# Patient Record
Sex: Male | Born: 1980 | Race: White | Hispanic: No | Marital: Married | Smoking: Never smoker
Health system: Southern US, Community
[De-identification: ages and names within clinical notes are randomized; demographics above are authoritative.]

## PROBLEM LIST (undated history)

## (undated) DIAGNOSIS — C439 Malignant melanoma of skin, unspecified: Secondary | ICD-10-CM

## (undated) DIAGNOSIS — F419 Anxiety disorder, unspecified: Secondary | ICD-10-CM

## (undated) DIAGNOSIS — T7840XA Allergy, unspecified, initial encounter: Secondary | ICD-10-CM

## (undated) HISTORY — PX: SHOULDER SURGERY: SHX246

## (undated) HISTORY — PX: APPENDECTOMY: SHX54

## (undated) HISTORY — DX: Anxiety disorder, unspecified: F41.9

## (undated) HISTORY — PX: CHOLECYSTECTOMY: SHX55

## (undated) HISTORY — DX: Allergy, unspecified, initial encounter: T78.40XA

---

## 2018-01-30 ENCOUNTER — Other Ambulatory Visit: Payer: Self-pay | Admitting: Orthopedic Surgery

## 2018-01-30 DIAGNOSIS — M25511 Pain in right shoulder: Secondary | ICD-10-CM

## 2018-02-16 ENCOUNTER — Ambulatory Visit
Admission: RE | Admit: 2018-02-16 | Discharge: 2018-02-16 | Disposition: A | Discharge status: Home / Self Care | Payer: Managed Care, Other (non HMO) | Source: Ambulatory Visit | Attending: Orthopedic Surgery | Admitting: Orthopedic Surgery

## 2018-02-16 ENCOUNTER — Ambulatory Visit
Admission: RE | Admit: 2018-02-16 | Discharge: 2018-02-16 | Disposition: A | Discharge status: Home / Self Care | Payer: Self-pay | Source: Ambulatory Visit | Attending: Orthopedic Surgery | Admitting: Orthopedic Surgery

## 2018-02-16 DIAGNOSIS — M25511 Pain in right shoulder: Secondary | ICD-10-CM

## 2018-02-16 MED ORDER — IOPAMIDOL (ISOVUE-M 200) INJECTION 41%
15.0000 mL | Freq: Once | INTRAMUSCULAR | Status: AC
  Administered 2018-02-16: 15 mL via INTRA_ARTICULAR

## 2019-02-04 IMAGING — MR MR SHOULDER*R* W/CM
6 series · 40 of 40 positions shown · IV contrast (agent unspecified)
Comparison: Images from contrast injection reviewed.

CLINICAL DATA: Right shoulder pain and clicking for 6-8 months. No
known injury.

EXAM:
MR ARTHROGRAM OF THE RIGHT SHOULDER
TECHNIQUE: Multiplanar, multisequence MR imaging of the right shoulder was
performed following the administration of intra-articular contrast.
CONTRAST:  See Injection Documentation.

[Series 4: T1 fat-sat · axial · 4.0mm · 0.25mm/px · z∈[-47,+47]mm · 6 of 21 slices shown (1 of 4)]
[im 1/21]
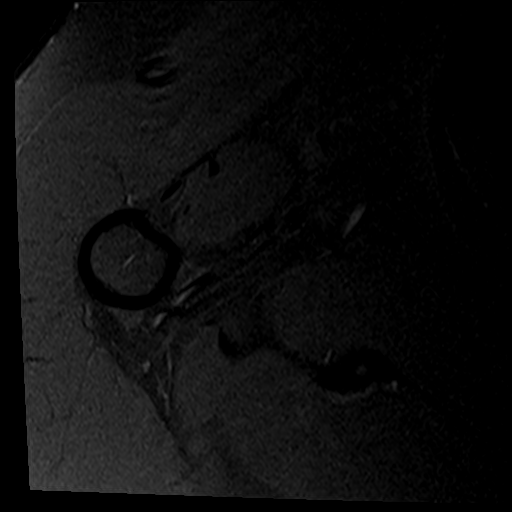
[im 5/21]
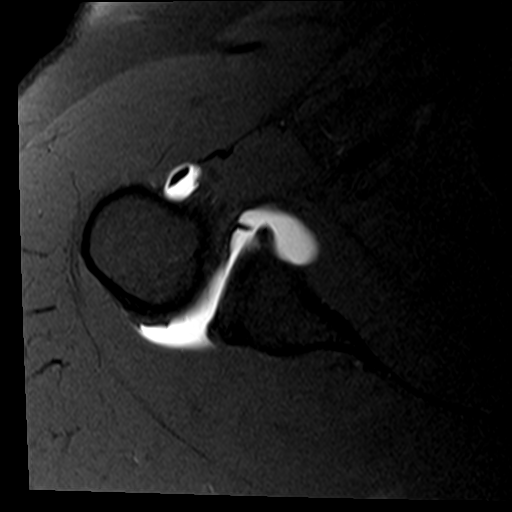
[im 9/21]
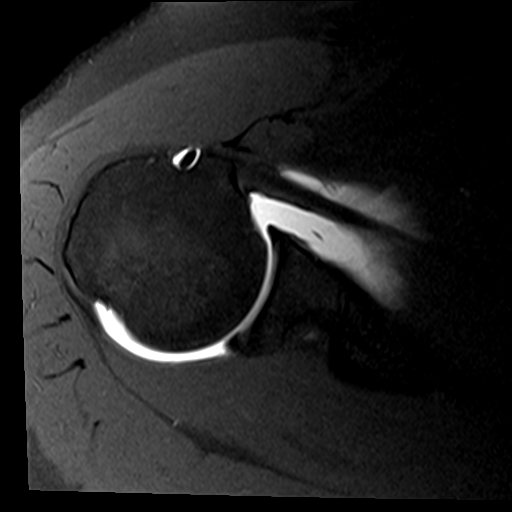
[im 13/21]
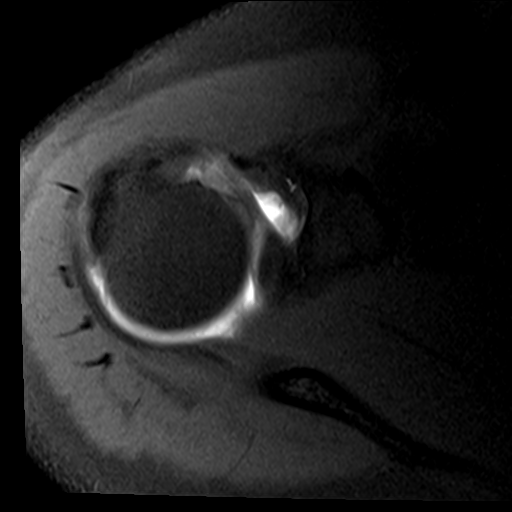
[im 17/21]
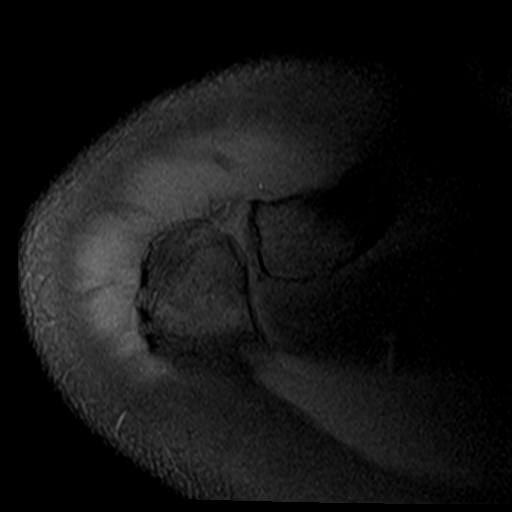
[im 21/21]
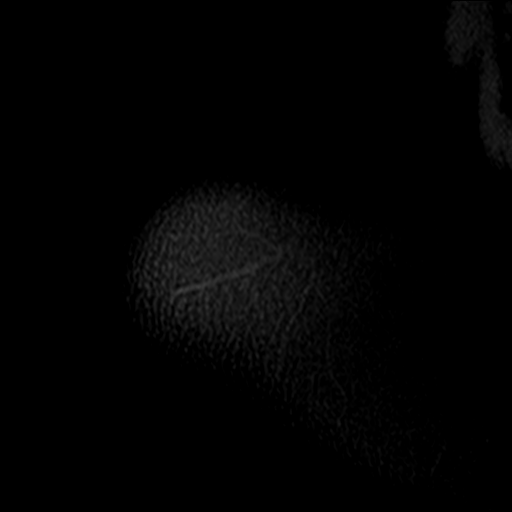

[Series 5: T2 fat-sat · oblique · 4.0mm · 0.55mm/px · 7 of 23 slices shown (1 of 2)]
[im 1/23]
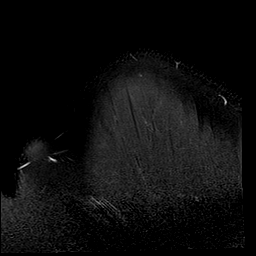
[im 4/23]
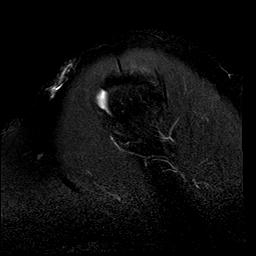
[im 8/23]
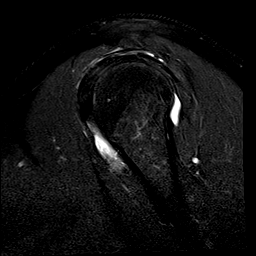
[im 12/23]
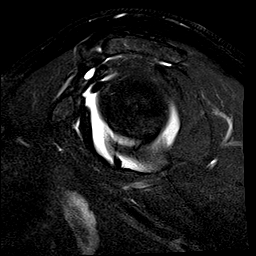
[im 15/23]
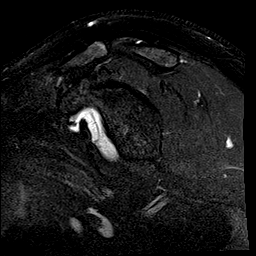
[im 19/23]
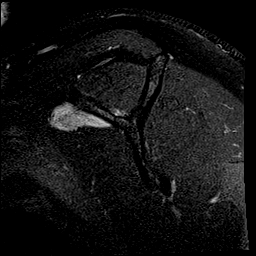
[im 23/23]
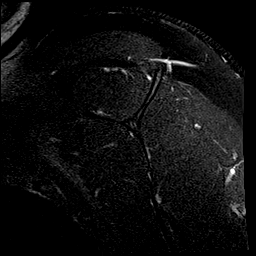

[Series 6: T1 fat-sat · oblique · 4.0mm · 0.44mm/px · 7 of 20 slices shown (2 of 4)]
[im 1/20]
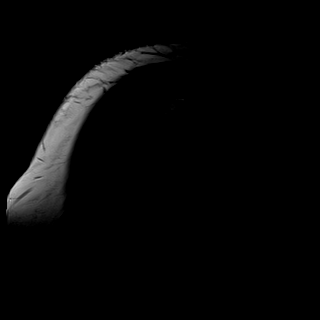
[im 4/20]
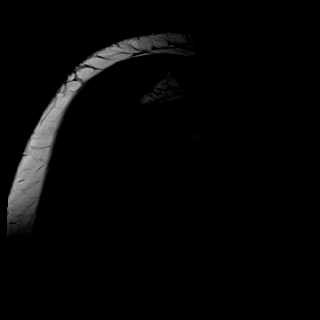
[im 7/20]
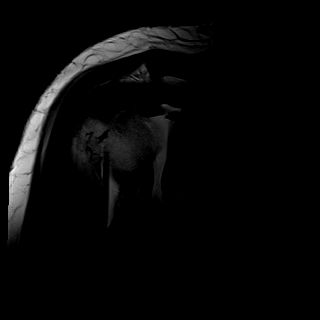
[im 10/20]
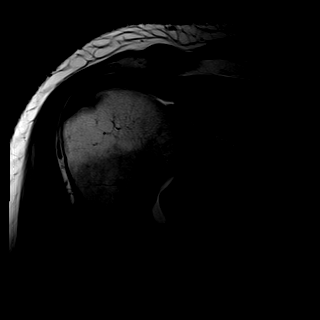
[im 13/20]
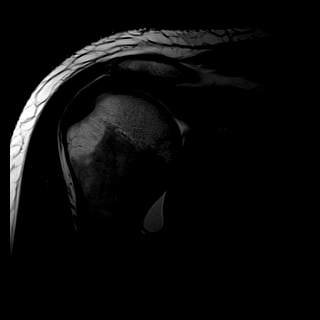
[im 16/20]
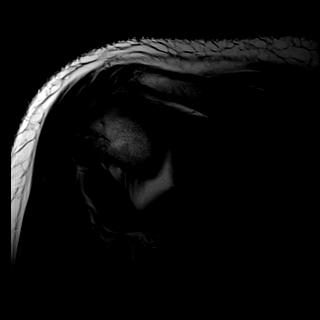
[im 20/20]
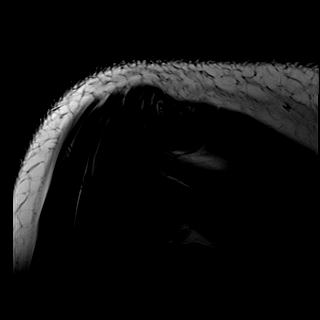

[Series 7: T1 fat-sat · oblique · 4.0mm · 0.55mm/px · 7 of 20 slices shown (3 of 4)]
[im 1/20]
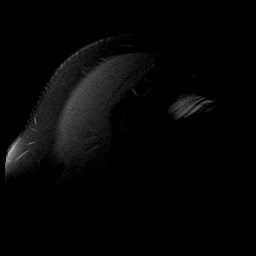
[im 4/20]
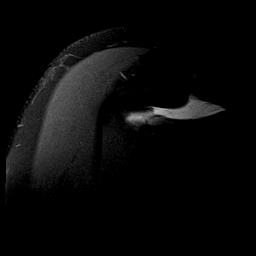
[im 7/20]
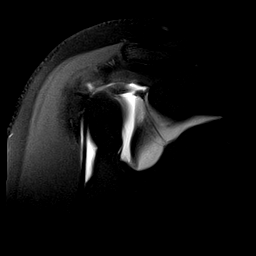
[im 10/20]
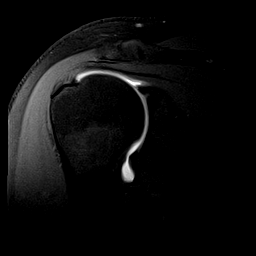
[im 13/20]
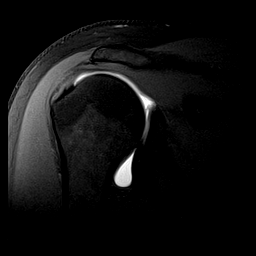
[im 16/20]
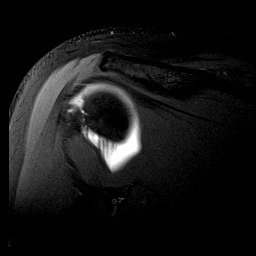
[im 20/20]
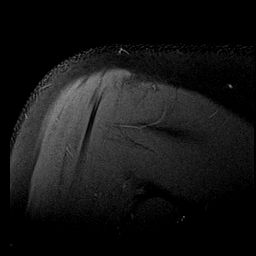

[Series 8: T2 fat-sat · oblique · 4.0mm · 0.55mm/px · 7 of 20 slices shown (2 of 2)]
[im 1/20]
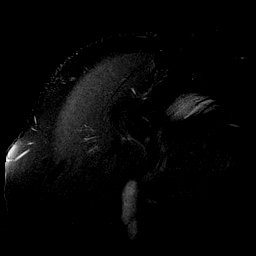
[im 4/20]
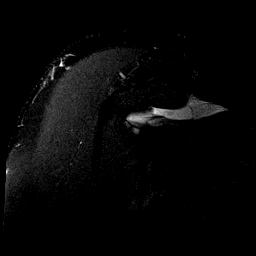
[im 7/20]
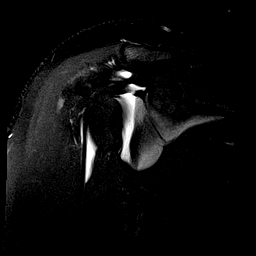
[im 10/20]
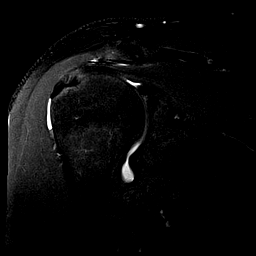
[im 13/20]
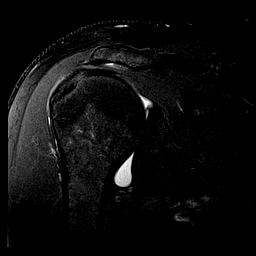
[im 16/20]
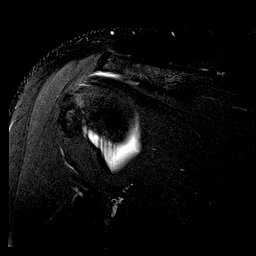
[im 20/20]
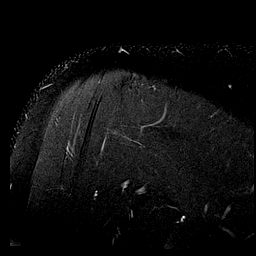

[Series 11: T1 fat-sat · sagittal · 4.0mm · 0.59mm/px · 6 of 19 slices shown (4 of 4)]
[im 1/19]
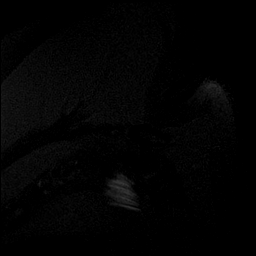
[im 4/19]
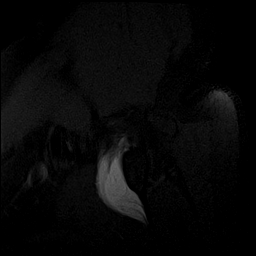
[im 8/19]
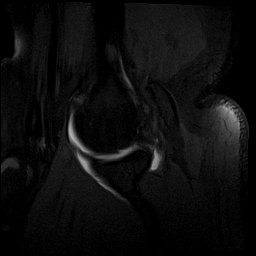
[im 11/19]
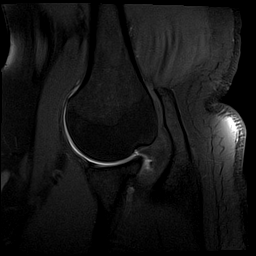
[im 15/19]
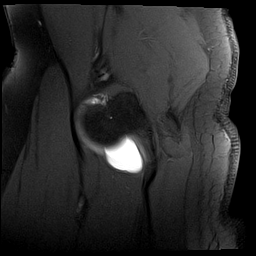
[im 19/19]
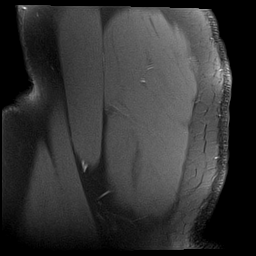

[40 of 40 positions shown; findings below may reference images not displayed]

FINDINGS: Rotator cuff: Mild to moderate rotator cuff tendinopathy without
tear is most notable in the supraspinatus.

Muscles: Normal without atrophy or focal lesion.

Biceps long head: Intact and normal in appearance. The biceps
attachment to the superior labrum appears normal.

Acromioclavicular Joint: Normal.

Glenohumeral Joint: Normal.

Labrum: Intact.

Bones: No fracture or worrisome lesion. The acromion is type 2 with
a small subacromial spur. There is a small volume of fluid but no
contrast in the subacromial/subdeltoid bursa.
IMPRESSION: Mild to moderate supraspinatus and infraspinatus tendinopathy
without tear.

Intact long head of biceps and glenoid labrum.

Small volume of subacromial/subdeltoid fluid consistent with
bursitis.

Type 2 acromion with small subacromial spur noted.

## 2020-05-27 ENCOUNTER — Encounter (HOSPITAL_BASED_OUTPATIENT_CLINIC_OR_DEPARTMENT_OTHER): Payer: Self-pay

## 2020-05-27 ENCOUNTER — Emergency Department (HOSPITAL_BASED_OUTPATIENT_CLINIC_OR_DEPARTMENT_OTHER)
Admission: EM | Admit: 2020-05-27 | Discharge: 2020-05-27 | Disposition: A | Discharge status: Home / Self Care | Payer: Managed Care, Other (non HMO) | Attending: Emergency Medicine | Admitting: Emergency Medicine

## 2020-05-27 ENCOUNTER — Other Ambulatory Visit: Payer: Self-pay

## 2020-05-27 DIAGNOSIS — R4689 Other symptoms and signs involving appearance and behavior: Secondary | ICD-10-CM

## 2020-05-27 DIAGNOSIS — R6883 Chills (without fever): Secondary | ICD-10-CM | POA: Diagnosis not present

## 2020-05-27 DIAGNOSIS — Z79899 Other long term (current) drug therapy: Secondary | ICD-10-CM | POA: Diagnosis not present

## 2020-05-27 DIAGNOSIS — R4182 Altered mental status, unspecified: Secondary | ICD-10-CM | POA: Diagnosis present

## 2020-05-27 LAB — BASIC METABOLIC PANEL
Anion gap: 8 (ref 5–15)
BUN: 16 mg/dL (ref 6–20)
CO2: 24 mmol/L (ref 22–32)
Calcium: 8.9 mg/dL (ref 8.9–10.3)
Chloride: 102 mmol/L (ref 98–111)
Creatinine, Ser: 0.8 mg/dL (ref 0.61–1.24)
GFR calc Af Amer: >60 mL/min (ref >60)
GFR calc non Af Amer: >60 mL/min (ref >60)
Glucose, Bld: 94 mg/dL (ref 70–99)
Potassium: 3.9 mmol/L (ref 3.5–5.1)
Sodium: 134 mmol/L — ABNORMAL LOW (ref 135–145)

## 2020-05-27 LAB — CBC
HCT: 43 % (ref 39.0–52.0)
Hemoglobin: 14.7 g/dL (ref 13.0–17.0)
MCH: 28.4 pg (ref 26.0–34.0)
MCHC: 34.2 g/dL (ref 30.0–36.0)
MCV: 83 fL (ref 80.0–100.0)
Platelets: 236 10*3/uL (ref 150–400)
RBC: 5.18 MIL/uL (ref 4.22–5.81)
RDW: 12.8 % (ref 11.5–15.5)
WBC: 9 10*3/uL (ref 4.0–10.5)
nRBC: 0 % (ref 0.0–0.2)

## 2020-05-27 LAB — CBG MONITORING, ED: Glucose-Capillary: 84 mg/dL (ref 70–99)

## 2020-05-27 LAB — URINALYSIS, ROUTINE W REFLEX MICROSCOPIC
Bilirubin Urine: NEGATIVE
Glucose, UA: NEGATIVE mg/dL
Hgb urine dipstick: NEGATIVE
Ketones, ur: NEGATIVE mg/dL
Leukocytes,Ua: NEGATIVE
Nitrite: NEGATIVE
Protein, ur: NEGATIVE mg/dL
Specific Gravity, Urine: 1.01 (ref 1.005–1.030)
pH: 6 (ref 5.0–8.0)

## 2020-05-27 NOTE — Discharge Instructions (Signed)
I think your symptoms are most likely from delta 8 as we spoke about.  However they could be from panic attacks, please use the attached instructions.  Your work-up today was reassuring, if you still have the symptoms please follow-up with your primary care in the next week.  Please come back to the emergency department for any new or worsening concerning symptoms.  Stop using Delta 8.

## 2020-05-27 NOTE — ED Triage Notes (Addendum)
Pt states he had episode yesterday where "things around me didn't feel real"-"shaky hands-cold sweats"-he felt it had been ~5 min but later realized it had been 35 mins-pt was working [employer] he did have a HA after event-states he felt better/baseline but "in a fog" the rest of the day-similar episode again today ~915am at work-lasted ~20 min-A/O-NAD-steady gait-denies any recent head injury-pt later added he has been using "delta 8" x 1-2 weeks

## 2020-05-27 NOTE — ED Provider Notes (Signed)
[facility] Provider Note   CSN: [id number] Arrival date & time: 05/27/20  1156     History Chief Complaint  Patient presents with  . Altered Mental Status    [facility] is a 39 y.o. male with past medical history of anxiety that presents the emergency department today for being altered.  Patient present with wife, states that for the past 2 weeks patient has been not acting himself.  Describes that he is having mental fog.  States that he cannot concentrate at work, is extremely anxious and having chills and cold sweats during work.  States that time feels like time is passing faster than normal.  Wife also states that he has not been oriented to time lately.  Started using delta 8 about 2 weeks ago.  Last use was 3 days ago.  Denies any dizziness, headache, vision changes, blurry vision, neck pain, chest pain, shortness of breath, nausea, vomiting, abdominal pain.  Denies any sick contacts.  Has not gotten Covid.  Has gotten Covid vaccine.  Denies any sick contacts.  States that he was generally healthy before this, however does have history of anxiety.  States that he has never had a panic attack before, states that cold sweats occur and then brain fog lasts for several hours.  Is intermittent.  Is not experiencing any of them now.  Last episode last night.  No other drug use.  No alcohol use.  No numbness, tingling, weakness, syncope.  No fevers.  Eating and drinking normally.  No toxins that he is aware of at work or at home.  No liver problems.  No new medications.  No head trauma. HPI     Past Medical History:  Diagnosis Date  . Allergies   . Anxiety     There are no problems to display for this patient.   Past Surgical History:  Procedure Laterality Date  . APPENDECTOMY    . CHOLECYSTECTOMY    . SHOULDER SURGERY         No family history on file.  Social History   Tobacco Use  . Smoking status: Never Smoker  . Smokeless tobacco:  Never Used  Vaping Use  . Vaping Use: Never used  Substance Use Topics  . Alcohol use: Yes    Comment: occ  . Drug use: Never    Home Medications Prior to Admission medications   Medication Sig Start Date End Date Taking? Authorizing Provider  escitalopram (LEXAPRO) 10 MG tablet Take 10 mg by mouth at bedtime.   Yes [provider]  loratadine (CLARITIN) 10 MG tablet Take 10 mg by mouth at bedtime.   Yes [provider]  montelukast (SINGULAIR) 10 MG tablet Take 10 mg by mouth at bedtime.   Yes [provider]    Allergies    Patient has no known allergies.  Review of Systems   Review of Systems  Constitutional: Positive for chills. Negative for diaphoresis, fatigue and fever.  HENT: Negative for congestion, sore throat and trouble swallowing.   Eyes: Negative for pain and visual disturbance.  Respiratory: Negative for cough, shortness of breath and wheezing.   Cardiovascular: Negative for chest pain, palpitations and leg swelling.  Gastrointestinal: Negative for abdominal distention, abdominal pain, diarrhea, nausea and vomiting.  Genitourinary: Negative for difficulty urinating.  Musculoskeletal: Negative for back pain, neck pain and neck stiffness.  Skin: Negative for pallor.  Neurological: Negative for dizziness, tremors, seizures, syncope, facial asymmetry, speech difficulty, weakness, light-headedness,  numbness and headaches.  Psychiatric/Behavioral: Negative for confusion.    Physical Exam Updated Vital Signs BP 125/75 (BP Location: Right Arm)   Pulse 82   Temp 97.8 F (36.6 C) (Oral)   Resp 18   Ht 5\' 11"  (1.803 m)   Wt 117 kg   SpO2 100%   BMI 35.98 kg/m   Physical Exam Constitutional:      General: He is not in acute distress.    Appearance: Normal appearance. He is not ill-appearing, toxic-appearing or diaphoretic.  HENT:     Head: Normocephalic and atraumatic.     Mouth/Throat:     Mouth: Mucous membranes are moist.      Pharynx: Oropharynx is clear.  Eyes:     General: No scleral icterus.    Extraocular Movements: Extraocular movements intact.     Pupils: Pupils are equal, round, and reactive to light.  Cardiovascular:     Rate and Rhythm: Normal rate and regular rhythm.     Pulses: Normal pulses.     Heart sounds: Normal heart sounds.  Pulmonary:     Effort: Pulmonary effort is normal. No respiratory distress.     Breath sounds: Normal breath sounds. No stridor. No wheezing, rhonchi or rales.  Chest:     Chest wall: No tenderness.  Abdominal:     General: Abdomen is flat. There is no distension.     Palpations: Abdomen is soft.     Tenderness: There is no abdominal tenderness. There is no guarding or rebound.  Musculoskeletal:        General: No swelling or tenderness. Normal range of motion.     Cervical back: Normal range of motion and neck supple. No rigidity.     Right lower leg: No edema.     Left lower leg: No edema.  Skin:    General: Skin is warm and dry.     Capillary Refill: Capillary refill takes less than 2 seconds.     Coloration: Skin is not pale.  Neurological:     General: No focal deficit present.     Mental Status: He is alert and oriented to person, place, and time.     Comments: Alert. Clear speech. No facial droop. CNIII-XII grossly intact. Bilateral upper and lower extremities' sensation grossly intact. 5/5 symmetric strength with grip strength and with plantar and dorsi flexion bilaterally. P Normal finger to nose bilaterally. Negative pronator drift. Negative Romberg sign. Gait is steady and intact.    Psychiatric:        Mood and Affect: Mood normal.        Behavior: Behavior normal.     ED Results / Procedures / Treatments   Labs (all labs ordered are listed, but only abnormal results are displayed) Labs Reviewed  BASIC METABOLIC PANEL - Abnormal; Notable for the following components:      Result Value   Sodium 134 (*)    All other components within normal  limits  CBC  URINALYSIS, ROUTINE W REFLEX MICROSCOPIC  CBG MONITORING, ED    EKG EKG Interpretation  Date/Time:  Wednesday May 27 2020 12:33:53 EDT Ventricular Rate:  77 PR Interval:  138 QRS Duration: 110 QT Interval:  380 QTC Calculation: 430 R Axis:   -49 Text Interpretation: Normal sinus rhythm Pulmonary disease pattern Left anterior fascicular block Abnormal ECG No old tracing to compare Confirmed by Aletta Edouard [phone]) on 05/27/2020 12:46:33 PM   Radiology No results found.  Procedures Procedures (including critical care time)  Medications Ordered in ED Medications - No data to display  ED Course  I have reviewed the triage vital signs and the nursing notes.  Pertinent labs & imaging results that were available during my care of the patient were reviewed by me and considered in my medical decision making (see chart for details).    MDM Rules/Calculators/A&P                         Fergus Throne is a 39 y.o. male with past medical history of anxiety that presents the emergency department today for being altered.  Patient with normal neuro exam, is alert and oriented to person place time and situation.  Not altered on  my exam, no signs of meningismus or infection.  States that he feels much better today than he did yesterday.  Symptoms improving.  Symptoms most likely from delta 8, did discuss that this could be a panic attack as well.  Patient is currently on Lexapro.  Spoke to poison control,  Marita [place], who recommends nothing at this time.  Metabolize time questionable.  Work-up reassuring, patient be discharged with close follow-up follow-up with PCP.  Did discuss that if he does not get better in the next couple of days time come back to the emergency department or see PCP.  Patient and wife agreeable.  EKG without any signs of ischemia, does show other abnormalities, patient is aware and is already following cardiologist for this.  Doubt need for further  emergent work up at this time. I explained the diagnosis and have given explicit precautions to return to the ER including for any other new or worsening symptoms. The patient understands and accepts the medical plan as it's been dictated and I have answered their questions. Discharge instructions concerning home care and prescriptions have been given. The patient is STABLE and is discharged to home in good condition.   Final Clinical Impression(s) / ED Diagnoses Final diagnoses:  Altered behavior    Rx / DC Orders ED Discharge Orders    None       Alfredia Client, PA-C 05/27/20 1735    Hayden Rasmussen, MD 05/27/20 1921

## 2021-04-22 ENCOUNTER — Emergency Department (INDEPENDENT_AMBULATORY_CARE_PROVIDER_SITE_OTHER)
Admission: EM | Admit: 2021-04-22 | Discharge: 2021-04-22 | Disposition: A | Discharge status: Home / Self Care | Payer: BC Managed Care – PPO | Source: Home / Self Care | Attending: Family Medicine | Admitting: Family Medicine

## 2021-04-22 ENCOUNTER — Encounter: Payer: Self-pay | Admitting: Emergency Medicine

## 2021-04-22 ENCOUNTER — Other Ambulatory Visit: Payer: Self-pay

## 2021-04-22 DIAGNOSIS — J029 Acute pharyngitis, unspecified: Secondary | ICD-10-CM

## 2021-04-22 HISTORY — DX: Malignant melanoma of skin, unspecified: C43.9

## 2021-04-22 LAB — POCT RAPID STREP A (OFFICE): Rapid Strep A Screen: NEGATIVE

## 2021-04-22 NOTE — ED Triage Notes (Signed)
Patient states that he began with a sore throat yesterday, body aches today, fever, body chills.  Negative home COVID test today x 2.  Patient is vaccinated for COVID.  Patient did take Ibuprofen @ 4pm today.

## 2021-04-22 NOTE — Discharge Instructions (Addendum)
Drink plenty of fluids Take Tylenol or ibuprofen for pain and fever May use cough and cold medicine as needed Quarantine until your test results are available Call for questions or problems  Test results will be available in MyChart

## 2021-04-22 NOTE — ED Provider Notes (Signed)
[facility]    CSN: [id number] Arrival date & time: 04/22/21  1913      History   Chief Complaint Chief Complaint  Patient presents with   Generalized Body Aches    HPI [facility] is a 40 y.o. male.   HPI  Started with a sore throat yesterday.  He states is worsened today.  He has had a fever to 102.  Body aches.  Headache.  Fatigue.  He has done 2 COVID test.  They are both negative.  He is COVID vaccinated.  He has a sick 68-year-old at home who also has a febrile illness with respiratory symptoms.  The 29-year-old was seen by his pediatrician and placed on amoxicillin.  He went back to the doctor today because he is no better.  Father is uncertain what testing has been done on the child. No exposure to strep or COVID  Past Medical History:  Diagnosis Date   Allergies    Anxiety    Melanoma ([place])     There are no problems to display for this patient.   Past Surgical History:  Procedure Laterality Date   APPENDECTOMY     CHOLECYSTECTOMY     SHOULDER SURGERY         Home Medications    Prior to Admission medications   Medication Sig Start Date End Date Taking? Authorizing Provider  escitalopram (LEXAPRO) 10 MG tablet Take 10 mg by mouth at bedtime.   Yes [provider]  loratadine (CLARITIN) 10 MG tablet Take 10 mg by mouth at bedtime.   Yes [provider]  montelukast (SINGULAIR) 10 MG tablet Take 10 mg by mouth at bedtime.   Yes [provider]    Family History No family history on file.  Social History Social History   Tobacco Use   Smoking status: Never   Smokeless tobacco: Never  Vaping Use   Vaping Use: Never used  Substance Use Topics   Alcohol use: Yes    Comment: occ   Drug use: Never     Allergies   Patient has no known allergies.   Review of Systems Review of Systems See HPI  Physical Exam Triage Vital Signs ED Triage Vitals  Enc Vitals Group     BP 04/22/21 1928 121/73      Pulse Rate 04/22/21 1928 91     Resp --      Temp 04/22/21 1928 99.5 F (37.5 C)     Temp Source 04/22/21 1928 Oral     SpO2 04/22/21 1928 97 %     Weight 04/22/21 1929 250 lb (113.4 kg)     Height 04/22/21 1929 '5\' 11"'$  (1.803 m)     Head Circumference --      Peak Flow --      Pain Score 04/22/21 1929 6     Pain Loc --      Pain Edu? --      Excl. in [place]? --    No data found.  Updated Vital Signs BP 121/73 (BP Location: Right Arm)   Pulse 91   Temp 99.5 F (37.5 C) (Oral)   Ht '5\' 11"'$  (1.803 m)   Wt 113.4 kg   SpO2 97%   BMI 34.87 kg/m      Physical Exam Constitutional:      General: He is not in acute distress.    Appearance: Normal appearance. He is well-developed.     Comments: Muscular and stocky.  Appears  ill.  Tired.  Chilled  HENT:     Head: Normocephalic and atraumatic.     Right Ear: Tympanic membrane, ear canal and external ear normal.     Left Ear: Ear canal and external ear normal.     Ears:     Comments: Left TM has slight injection    Nose: Nose normal. No congestion.     Mouth/Throat:     Mouth: Mucous membranes are moist.     Pharynx: Posterior oropharyngeal erythema present.     Comments: Tonsils mildly enlarged with erythema.  No exudate Eyes:     Conjunctiva/sclera: Conjunctivae normal.     Pupils: Pupils are equal, round, and reactive to light.  Cardiovascular:     Rate and Rhythm: Normal rate and regular rhythm.     Heart sounds: Normal heart sounds.  Pulmonary:     Effort: Pulmonary effort is normal. No respiratory distress.     Breath sounds: Normal breath sounds. No wheezing or rales.  Abdominal:     General: There is no distension.     Palpations: Abdomen is soft.  Musculoskeletal:        General: Normal range of motion.     Cervical back: Normal range of motion.  Lymphadenopathy:     Cervical: Cervical adenopathy present.  Skin:    General: Skin is warm and dry.  Neurological:     Mental Status: He is alert.  Psychiatric:         Mood and Affect: Mood normal.        Behavior: Behavior normal.     UC Treatments / Results  Labs (all labs ordered are listed, but only abnormal results are displayed) Labs Reviewed  CULTURE, GROUP A STREP  COVID-19, FLU A+B NAA  POCT RAPID STREP A (OFFICE)    EKG   Radiology No results found.  Procedures Procedures (including critical care time)  Medications Ordered in UC Medications - No data to display  Initial Impression / Assessment and Plan / UC Course  I have reviewed the triage vital signs and the nursing notes.  Pertinent labs & imaging results that were available during my care of the patient were reviewed by me and considered in my medical decision making (see chart for details).     Strep test is negative.  Strep culture is pending.  Flu and COVID testing is performed.  Will be available in a day or 2.  Patient is to go home and quarantine.  He is given a note to be off work.  He has had COVID before in January, but I explained that even with his immunizations and COVID infection he can catch it again. Final Clinical Impressions(s) / UC Diagnoses   Final diagnoses:  Acute pharyngitis, unspecified etiology     Discharge Instructions      Drink plenty of fluids Take Tylenol or ibuprofen for pain and fever May use cough and cold medicine as needed Quarantine until your test results are available Call for questions or problems  Test results will be available in MyChart     ED Prescriptions   None    PDMP not reviewed this encounter.   Raylene Everts, MD 04/22/21 2001

## 2021-04-25 LAB — COVID-19, FLU A+B NAA
Influenza A, NAA: NOT DETECTED
Influenza B, NAA: NOT DETECTED
SARS-CoV-2, NAA: NOT DETECTED

## 2021-04-27 LAB — CULTURE, GROUP A STREP
